# Patient Record
Sex: Male | Born: 1995 | Race: White | Hispanic: No | Marital: Single | Smoking: Never smoker
Health system: Southern US, Community
[De-identification: ages and names within clinical notes are randomized; demographics above are authoritative.]

## PROBLEM LIST (undated history)

## (undated) DIAGNOSIS — E063 Autoimmune thyroiditis: Secondary | ICD-10-CM

## (undated) DIAGNOSIS — E049 Nontoxic goiter, unspecified: Secondary | ICD-10-CM

## (undated) DIAGNOSIS — E230 Hypopituitarism: Secondary | ICD-10-CM

## (undated) DIAGNOSIS — I422 Other hypertrophic cardiomyopathy: Secondary | ICD-10-CM

## (undated) DIAGNOSIS — R6250 Unspecified lack of expected normal physiological development in childhood: Secondary | ICD-10-CM

## (undated) HISTORY — DX: Hypopituitarism: E23.0

## (undated) HISTORY — DX: Autoimmune thyroiditis: E06.3

## (undated) HISTORY — PX: CIRCUMCISION: SUR203

## (undated) HISTORY — DX: Nontoxic goiter, unspecified: E04.9

## (undated) HISTORY — DX: Unspecified lack of expected normal physiological development in childhood: R62.50

## (undated) HISTORY — DX: Other hypertrophic cardiomyopathy: I42.2

---
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
## 2017-05-04 ENCOUNTER — Encounter: Payer: Self-pay | Admitting: Physician Assistant

## 2017-05-04 ENCOUNTER — Ambulatory Visit: Payer: BC Managed Care – PPO | Admitting: Physician Assistant

## 2017-05-04 ENCOUNTER — Other Ambulatory Visit: Payer: Self-pay

## 2017-05-04 ENCOUNTER — Ambulatory Visit (INDEPENDENT_AMBULATORY_CARE_PROVIDER_SITE_OTHER): Payer: BC Managed Care – PPO

## 2017-05-04 VITALS — BP 102/66 | HR 77 | Temp 98.7°F | Resp 16 | Ht 68.0 in | Wt 126.8 lb

## 2017-05-04 DIAGNOSIS — Q799 Congenital malformation of musculoskeletal system, unspecified: Secondary | ICD-10-CM | POA: Diagnosis not present

## 2017-05-04 NOTE — Progress Notes (Signed)
    05/04/2017 4:08 PM   DOB: 1996-02-11 / MRN: [id number]  SUBJECTIVE:  Benjamin Martinez is a 22 y.o. male presenting for bony growth about the dorsum of the left foot.  Started about 3 months ago.  He appears to be medically complex per his history.  He is feeling well at this time.  He has No Known Allergies.   He  has a past medical history of Goiter, Growth hormone deficiency (human) (HCC), Hypertrophic cardiomyopathy (HCC), Physical growth delay, and Thyroiditis, autoimmune.    He  reports that  has never smoked. he has never used smokeless tobacco. He reports that he drinks alcohol. He reports that he does not use drugs. He  reports that he does not engage in sexual activity. The patient  has a past surgical history that includes Circumcision.  His family history is not on file. He was adopted.  Review of Systems  Constitutional: Negative for fever, malaise/fatigue and weight loss.    The problem list and medications were reviewed and updated by myself where necessary and exist elsewhere in the encounter.   OBJECTIVE:  BP 102/66 (BP Location: Left Arm, Patient Position: Sitting, Cuff Size: Normal)   Pulse 77   Temp 98.7 F (37.1 C) (Oral)   Resp 16   Ht 5\' 8"  (1.727 m)   Wt 126 lb 12.8 oz (57.5 kg)   SpO2 99%   BMI 19.28 kg/m   Physical Exam  Constitutional: He appears well-developed. He is active and cooperative.  Non-toxic appearance.  Cardiovascular: Normal rate.  Pulmonary/Chest: Effort normal. No tachypnea.  Musculoskeletal:       Feet:  Neurological: He is alert.  Skin: Skin is warm and dry. He is not diaphoretic. No pallor.  Vitals reviewed.   No results found for this or any previous visit (from the past 72 hour(s)).  No results found.  ASSESSMENT AND PLAN:  Lyn Hollingsheadlexander was seen today for bone spur top of left foot.  Diagnoses and all orders for this visit:  Bony abnormality: Patient is interesting.  Appears he has a history of hypertrophic  cardiomyopathy along with several endocrinology abnormalities.  [facility]  I am going to send him to podiatry.  He does not have a great deal of pain with rest but this bone callus does bother him with physical activity. -     DG Foot Complete Left; Future -     Calcium -     Phosphorus -     Alkaline phosphatase      The patient is advised to call or return to clinic if he does not see an improvement in symptoms, or to seek the care of the closest emergency department if he worsens with the above plan.   Deliah BostonMichael Clark, MHS, PA-C [facility] at [facility] 05/04/2017 4:08 PM

## 2017-05-04 NOTE — Patient Instructions (Signed)
     IF you received an x-ray today, you will receive an invoice from [facility]. Please contact [facility] at [phone] with questions or concerns regarding your invoice.   IF you received labwork today, you will receive an invoice from [facility]. Please contact [facility] at [phone] with questions or concerns regarding your invoice.   Our billing staff will not be able to assist you with questions regarding bills from these companies.  You will be contacted with the lab results as soon as they are available. The fastest way to get your results is to activate your My Chart account. Instructions are located on the last page of this paperwork. If you have not heard from us regarding the results in 2 weeks, please contact this office.     

## 2017-05-05 LAB — CALCIUM: CALCIUM: 9.5 mg/dL (ref 8.7–10.2)

## 2017-05-05 LAB — PHOSPHORUS: Phosphorus: 3.6 mg/dL (ref 2.5–4.5)

## 2017-05-05 LAB — ALKALINE PHOSPHATASE: Alkaline Phosphatase: 46 IU/L (ref 39–117)

## 2017-05-24 ENCOUNTER — Ambulatory Visit: Payer: BC Managed Care – PPO | Admitting: Podiatry

## 2017-06-01 ENCOUNTER — Ambulatory Visit (INDEPENDENT_AMBULATORY_CARE_PROVIDER_SITE_OTHER): Payer: BC Managed Care – PPO

## 2017-06-01 ENCOUNTER — Ambulatory Visit: Payer: BC Managed Care – PPO | Admitting: Podiatry

## 2017-06-01 ENCOUNTER — Encounter: Payer: Self-pay | Admitting: Podiatry

## 2017-06-01 VITALS — BP 127/77 | HR 68

## 2017-06-01 DIAGNOSIS — M659 Unspecified synovitis and tenosynovitis, unspecified site: Secondary | ICD-10-CM

## 2017-06-01 DIAGNOSIS — M79671 Pain in right foot: Secondary | ICD-10-CM

## 2017-06-01 DIAGNOSIS — M19079 Primary osteoarthritis, unspecified ankle and foot: Secondary | ICD-10-CM

## 2017-06-01 DIAGNOSIS — M79672 Pain in left foot: Principal | ICD-10-CM

## 2017-06-01 MED ORDER — MELOXICAM 15 MG PO TABS: 15.0000 mg | ORAL_TABLET | Freq: Every day | ORAL | 0 refills | Status: DC

## 2017-06-18 NOTE — Progress Notes (Signed)
  Subjective:  Patient ID: Benjamin Martinez, male    DOB: 10-06-1995,  MRN: [id number]  Chief Complaint  Patient presents with  . Foot Pain    boney growth top of left foot - came up a few months ago, becoming painful   22 y.o. male presents with the above complaint.  Reports burning redness of left foot.  Denies injury or trauma.  States that it appeared few months ago.  Started to also have the same issue on the right side.  Says he tries to be active in the gym and has pain while doing so.  Past Medical History:  Diagnosis Date  . Goiter   . Growth hormone deficiency (human) (HCC)   . Hypertrophic cardiomyopathy (HCC)   . Physical growth delay   . Thyroiditis, autoimmune    Past Surgical History:  Procedure Laterality Date  . CIRCUMCISION      Current Outpatient Medications:  .  meloxicam (MOBIC) 15 MG tablet, Take 1 tablet (15 mg total) by mouth daily., Disp: 30 tablet, Rfl: 0  No Known Allergies Review of Systems: Negative except as noted in the HPI. Denies N/V/F/Ch. Objective:   Vitals:   06/01/17 0856  BP: 127/77  Pulse: 68   General AA&O x3. Normal mood and affect.  Vascular Dorsalis pedis and posterior tibial pulses  present 2+ bilaterally  Capillary refill normal to all digits. Pedal hair growth normal.  Neurologic Epicritic sensation grossly present.  Dermatologic No open lesions. Interspaces clear of maceration. Nails well groomed and normal in appearance.  Orthopedic: MMT 5/5 in dorsiflexion, plantarflexion, inversion, and eversion. Normal joint ROM without pain or crepitus.  Pain palpation dorsal second TMT with prominent dorsal osteophyte   Assessment & Plan:  Patient was evaluated and treated and all questions answered.  Midfoot Arthritis -XR taken and reviewed.  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 -Rx Meloxicam -Discussed considering surgical therapy should injections not help with his  pain  Procedure: Joint Injection Location: Left dorsal TMT joint Skin Prep: Alcohol. Injectate: 0.5 cc 1% lidocaine plain, 0.5 cc dexamethasone phosphate. Disposition: Patient tolerated procedure well. Injection site dressed with a band-aid.    Return in about 3 weeks (around 06/22/2017) for Arthritis f/u.

## 2017-06-22 ENCOUNTER — Ambulatory Visit: Payer: BC Managed Care – PPO | Admitting: Podiatry

## 2017-06-29 ENCOUNTER — Ambulatory Visit: Payer: BC Managed Care – PPO | Admitting: Podiatry

## 2017-06-29 ENCOUNTER — Other Ambulatory Visit: Payer: Self-pay

## 2017-07-05 ENCOUNTER — Ambulatory Visit: Payer: BC Managed Care – PPO | Admitting: Podiatry

## 2017-07-05 DIAGNOSIS — M19079 Primary osteoarthritis, unspecified ankle and foot: Secondary | ICD-10-CM

## 2017-07-05 DIAGNOSIS — M659 Synovitis and tenosynovitis, unspecified: Secondary | ICD-10-CM | POA: Diagnosis not present

## 2017-07-05 NOTE — Patient Instructions (Signed)
Pre-Operative Instructions  Congratulations, you have decided to take an important step towards improving your quality of life.  You can be assured that the doctors and staff at [facility] will be with you every step of the way.  Here are some important things you should know:  1. Plan to be at the surgery center/hospital at least 1 (one) hour prior to your scheduled time, unless otherwise directed by the surgical center/hospital staff.  You must have a responsible adult accompany you, remain during the surgery and drive you home.  Make sure you have directions to the surgical center/hospital to ensure you arrive on time. 2. If you are having surgery at [facility] or [facility] hospitals, you will need a copy of your medical history and physical form from your family physician within one month prior to the date of surgery. We will give you a form for your primary physician to complete.  3. We make every effort to accommodate the date you request for surgery.  However, there are times where surgery dates or times have to be moved.  We will contact you as soon as possible if a change in schedule is required.   4. No aspirin/ibuprofen for one week before surgery.  If you are on aspirin, any non-steroidal anti-inflammatory medications (Mobic, Aleve, Ibuprofen) should not be taken seven (7) days prior to your surgery.  You make take Tylenol for pain prior to surgery.  5. Medications - If you are taking daily heart and blood pressure medications, seizure, reflux, allergy, asthma, anxiety, pain or diabetes medications, make sure you notify the surgery center/hospital before the day of surgery so they can tell you which medications you should take or avoid the day of surgery. 6. No food or drink after midnight the night before surgery unless directed otherwise by surgical center/hospital staff. 7. No alcoholic beverages 24-hours prior to surgery.  No smoking 24-hours prior or 24-hours after  surgery. 8. Wear loose pants or shorts. They should be loose enough to fit over bandages, boots, and casts. 9. Don't wear slip-on shoes. Sneakers are preferred. 10. Bring your boot with you to the surgery center/hospital.  Also bring crutches or a walker if your physician has prescribed it for you.  If you do not have this equipment, it will be provided for you after surgery. 11. If you have not been contacted by the surgery center/hospital by the day before your surgery, call to confirm the date and time of your surgery. 12. Leave-time from work may vary depending on the type of surgery you have.  Appropriate arrangements should be made prior to surgery with your employer. 13. Prescriptions will be provided immediately following surgery by your doctor.  Fill these as soon as possible after surgery and take the medication as directed. Pain medications will not be refilled on weekends and must be approved by the doctor. 14. Remove nail polish on the operative foot and avoid getting pedicures prior to surgery. 15. Wash the night before surgery.  The night before surgery wash the foot and leg well with water and the antibacterial soap provided. Be sure to pay special attention to beneath the toenails and in between the toes.  Wash for at least three (3) minutes. Rinse thoroughly with water and dry well with a towel.  Perform this wash unless told not to do so by your physician.  Enclosed: 1 Ice pack (please put in freezer the night before surgery)   1 Hibiclens skin cleaner     Pre-op instructions  If you have any questions regarding the instructions, please do not hesitate to call our office.  [place]: [address], [place], [place] [address] -- [phone]  [place]: [address] [place], [place] [address] -- [phone]  [place]: [address]  [place], [place] [address] -- [phone]  [place]: [address], [address], [place], [place] [address] -- [phone]  Website:  [email or web address] 

## 2017-07-27 ENCOUNTER — Telehealth: Payer: Self-pay | Admitting: *Deleted

## 2017-07-27 ENCOUNTER — Encounter: Payer: Self-pay | Admitting: *Deleted

## 2017-07-27 NOTE — Telephone Encounter (Signed)
I received an [facility] [place] [facility].  She forwarded an email from Kellie MoorKayte Burney, English as a second language teacherre-Anesthesia RN.  She was inquiring if we had received medical clearance from [facility].  I was not aware he needed clearance.  I will send a letter to Dr. Elizebeth Brookingotton.  I sent a letter requesting clearance to Dr. Elizebeth Brookingotton as well as his primary care physician, Dr. Velvet BathePamela Warner.

## 2017-07-31 NOTE — Telephone Encounter (Signed)
I called Dr. Kimberlee NearingPamela Warner's office to check on the status of the medical clearance.  Jamila informed me that she is not in the office this morning but she would give it to her first thing in the morning.    "This is Jamila calling from [facility], Dr. Kimberlee NearingPamela Warner's office.  I spoke to Dr. Sheliah HatchWarner and she said she responded to the medical clearance request.  She said she got the request from you late Friday afternoon.  We had already closed.  She said she responded directly to Dr. Samuella CotaPrice about the medical clearance."  Can you possibly fax that to me?  "We are not on Epic, only the doctors have access to it."  Okay, I will keep looking for it.  Thank you so much for your help.  If you can't find it let me know and I will see if I can figure something out."  (Dr. Samuella CotaPrice, I can't find it in Epic.  Can you send it to me or send it to the surgical center please?)

## 2017-07-31 NOTE — Telephone Encounter (Signed)
We did receive the letter for clearance. I read it.

## 2017-07-31 NOTE — Telephone Encounter (Signed)
It was with my paperwork that I gave back to [employer]ica [employer]. Can we see if she has it?

## 2017-08-01 ENCOUNTER — Encounter: Payer: Self-pay | Admitting: Podiatry

## 2017-08-01 ENCOUNTER — Other Ambulatory Visit: Payer: Self-pay | Admitting: Podiatry

## 2017-08-01 DIAGNOSIS — M898X7 Other specified disorders of bone, ankle and foot: Secondary | ICD-10-CM | POA: Diagnosis not present

## 2017-08-01 DIAGNOSIS — M19072 Primary osteoarthritis, left ankle and foot: Secondary | ICD-10-CM | POA: Diagnosis not present

## 2017-08-01 MED ORDER — OXYCODONE-ACETAMINOPHEN 10-325 MG PO TABS: 1.0000 | ORAL_TABLET | ORAL | 0 refills | Status: DC | PRN

## 2017-08-01 MED ORDER — CEPHALEXIN 500 MG PO CAPS: 500.0000 mg | ORAL_CAPSULE | Freq: Two times a day (BID) | ORAL | 0 refills | Status: DC

## 2017-08-01 MED ORDER — PROMETHAZINE HCL 25 MG PO TABS: 25.0000 mg | ORAL_TABLET | Freq: Three times a day (TID) | ORAL | 0 refills | Status: DC | PRN

## 2017-08-03 ENCOUNTER — Other Ambulatory Visit: Payer: BC Managed Care – PPO

## 2017-08-09 ENCOUNTER — Ambulatory Visit (INDEPENDENT_AMBULATORY_CARE_PROVIDER_SITE_OTHER): Payer: Self-pay | Admitting: Podiatry

## 2017-08-09 ENCOUNTER — Ambulatory Visit (INDEPENDENT_AMBULATORY_CARE_PROVIDER_SITE_OTHER): Payer: BC Managed Care – PPO

## 2017-08-09 ENCOUNTER — Encounter: Payer: Self-pay | Admitting: Podiatry

## 2017-08-09 VITALS — Temp 97.8°F

## 2017-08-09 DIAGNOSIS — M659 Synovitis and tenosynovitis, unspecified: Secondary | ICD-10-CM

## 2017-08-09 DIAGNOSIS — M19079 Primary osteoarthritis, unspecified ankle and foot: Secondary | ICD-10-CM

## 2017-08-09 DIAGNOSIS — M898X7 Other specified disorders of bone, ankle and foot: Secondary | ICD-10-CM

## 2017-08-10 NOTE — Progress Notes (Signed)
DOS 08/01/2017  Midfoot exostectomy left.

## 2017-08-12 NOTE — Progress Notes (Signed)
  Subjective:  Patient ID: Benjamin Martinez, male    DOB: [birth year],  MRN: [id number]  Chief Complaint  Patient presents with  . Routine Post [employer]     dos 06.12.2019 Midfoot Exostectomy Lt " my foot hurt day 2-3 but feels fine now"     DOS: 08/01/17 Procedure: Midfoot exostectomy left  22 y.o. male returns for post-op check. Denies N/V/F/Ch. Pain is controlled with current medications.  Only had pain the first 2 days and had a history of chills right after the procedure but today feels fine  Objective:   General AA&O x3. Normal mood and affect.  Vascular Foot warm and well perfused.  Neurologic Gross sensation intact.  Dermatologic Skin healing well without signs of infection. Skin edges well coapted without signs of infection.  Orthopedic: Tenderness to palpation noted about the surgical site.   Assessment & Plan:  Patient was evaluated and treated and all questions answered.  S/p midfoot exostectomy left  -X-rays taken reviewed consistent with postop state -Progressing as expected post-operatively. -Sutures: Intact none. -Medications refilled: none -Foot redressed.  Return in about 1 week (around 08/16/2017).

## 2017-08-12 NOTE — Progress Notes (Signed)
  Subjective:  Patient ID: Benjamin Martinez, male    DOB: 1995/08/25,  MRN: [id number]  Chief Complaint  Patient presents with  . Arthritis    midfoot left - felt better x 10days or so, but now pain is back   22 y.o. male presents with the above complaint.  States the injection felt better for about 10 days or so but now is back.  Here with father today to further discuss  Past Medical History:  Diagnosis Date  . Goiter   . Growth hormone deficiency (human) (HCC)   . Hypertrophic cardiomyopathy (HCC)   . Physical growth delay   . Thyroiditis, autoimmune    Past Surgical History:  Procedure Laterality Date  . CIRCUMCISION      Current Outpatient Medications:  .  cephALEXin (KEFLEX) 500 MG capsule, Take 1 capsule (500 mg total) by mouth 2 (two) times daily., Disp: 14 capsule, Rfl: 0 .  meloxicam (MOBIC) 15 MG tablet, Take 1 tablet (15 mg total) by mouth daily., Disp: 30 tablet, Rfl: 0 .  oxyCODONE-acetaminophen (PERCOCET) 10-325 MG tablet, Take 1 tablet by mouth every 4 (four) hours as needed for pain., Disp: 20 tablet, Rfl: 0 .  promethazine (PHENERGAN) 25 MG tablet, Take 1 tablet (25 mg total) by mouth every 8 (eight) hours as needed for nausea or vomiting., Disp: 20 tablet, Rfl: 0 .  Somatropin (NORDITROPIN FLEXPRO) 30 MG/3ML SOLN, , Disp: , Rfl:   No Known Allergies Review of Systems: Negative except as noted in the HPI. Denies N/V/F/Ch. Objective:   There were no vitals filed for this visit. General AA&O x3. Normal mood and affect.  Vascular Dorsalis pedis and posterior tibial pulses  present 2+ bilaterally  Capillary refill normal to all digits. Pedal hair growth normal.  Neurologic Epicritic sensation grossly present.  Dermatologic No open lesions. Interspaces clear of maceration. Nails well groomed and normal in appearance.  Orthopedic: MMT 5/5 in dorsiflexion, plantarflexion, inversion, and eversion. Normal joint ROM without pain or crepitus.  Pain palpation dorsal  second TMT with prominent dorsal osteophyte   Assessment & Plan:  Patient was evaluated and treated and all questions answered.  Midfoot Arthritis -Discussed benefits of removing excess bone to prevent further irritation.  Patient understands and wishes to proceed all risk benefits and alternatives discussed with patient no guarantees given.  We will proceed for surgical  procedure in the ensuing weeks.  Patient has a cardiac history though he is young and healthy would recommend cardiac clearance prior to surgery.   Return for post op care.

## 2017-08-14 ENCOUNTER — Other Ambulatory Visit (INDEPENDENT_AMBULATORY_CARE_PROVIDER_SITE_OTHER): Payer: Self-pay | Admitting: *Deleted

## 2017-08-14 DIAGNOSIS — E034 Atrophy of thyroid (acquired): Secondary | ICD-10-CM

## 2017-08-16 ENCOUNTER — Ambulatory Visit (INDEPENDENT_AMBULATORY_CARE_PROVIDER_SITE_OTHER): Payer: BC Managed Care – PPO | Admitting: Podiatry

## 2017-08-16 DIAGNOSIS — Z9889 Other specified postprocedural states: Secondary | ICD-10-CM

## 2017-08-16 DIAGNOSIS — M898X7 Other specified disorders of bone, ankle and foot: Secondary | ICD-10-CM

## 2017-08-16 DIAGNOSIS — M19079 Primary osteoarthritis, unspecified ankle and foot: Secondary | ICD-10-CM

## 2017-08-16 NOTE — Progress Notes (Signed)
  Subjective:  Patient ID: Benjamin Martinez, male    DOB: November 19, 1995,  MRN: [id number]  Chief Complaint  Patient presents with  . Routine Post Op    pov#2 dos 06.12.2019 Midfoot Exostectomy Lt     DOS: 08/01/17 Procedure: Midfoot exostectomy left  22 y.o. male returns for post-op check. Denies N/V/F/Ch. Pain controlled. Denies complaints.  Objective:   General AA&O x3. Normal mood and affect.  Vascular Foot warm and well perfused.  Neurologic Gross sensation intact.  Dermatologic Skin healing well without signs of infection. Skin edges well coapted without signs of infection.  Orthopedic: No tenderness to palpation noted about the surgical site.   Assessment & Plan:  Patient was evaluated and treated and all questions answered.  S/p midfoot exostectomy left  -X-rays taken reviewed consistent with postop state -Progressing as expected post-operatively. -Sutures: ends cut. -Medications refilled: none -Foot redressed. -Switch to WBAT in surgical shoe. Shoe dispensed.  Return in about 2 weeks (around 08/30/2017) for Post-op.

## 2017-08-17 LAB — TSH: TSH: 0.95 m[IU]/L (ref 0.40–4.50)

## 2017-08-17 LAB — T4, FREE: Free T4: 1.5 ng/dL — ABNORMAL HIGH (ref 0.8–1.4)

## 2017-08-17 LAB — TESTOSTERONE TOTAL,FREE,BIO, MALES
ALBUMIN MSPROF: 4.7 g/dL (ref 3.6–5.1)
SEX HORMONE BINDING: 46 nmol/L (ref 10–50)
TESTOSTERONE BIOAVAILABLE: 174.1 ng/dL (ref >=110.0)
Testosterone, Free: 81.2 pg/mL (ref 46.0–224.0)
Testosterone: 757 ng/dL (ref 250–827)

## 2017-08-17 LAB — T3, FREE: T3, Free: 3.4 pg/mL (ref 3.0–4.7)

## 2017-08-21 ENCOUNTER — Encounter (INDEPENDENT_AMBULATORY_CARE_PROVIDER_SITE_OTHER): Payer: Self-pay | Admitting: "Endocrinology

## 2017-08-21 ENCOUNTER — Ambulatory Visit (INDEPENDENT_AMBULATORY_CARE_PROVIDER_SITE_OTHER): Payer: BC Managed Care – PPO | Admitting: "Endocrinology

## 2017-08-21 VITALS — BP 108/62 | HR 76 | Ht 68.7 in | Wt 121.2 lb

## 2017-08-21 DIAGNOSIS — R634 Abnormal weight loss: Secondary | ICD-10-CM | POA: Diagnosis not present

## 2017-08-21 DIAGNOSIS — E063 Autoimmune thyroiditis: Secondary | ICD-10-CM | POA: Diagnosis not present

## 2017-08-21 DIAGNOSIS — E049 Nontoxic goiter, unspecified: Secondary | ICD-10-CM

## 2017-08-21 DIAGNOSIS — I422 Other hypertrophic cardiomyopathy: Secondary | ICD-10-CM

## 2017-08-21 NOTE — Progress Notes (Signed)
Subjective:  Patient Name: Benjamin Martinez Date of Birth: 1995/10/16  MRN: [id number]  Benjamin Martinez  presents to the office today for follow-up evaluation and management of his growth hormone deficiency, physical growth delay, goiter, Hashimoto's thyroiditis, poor appetite, hypertrophic cardiomyopathy, ADHD, and migraines.  HISTORY OF PRESENT ILLNESS:   Benjamin Martinez is a 22 y.o. Caucasian young man.    [name] was accompanied by his adoptive father.  1. 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 He also had a problem of poor appetite due in large part to stimulant medications that he was taking for his ADHD. Even after the stimulants were discontinued for a time, however, his appetite remained poor. He was started on cyproheptadine as an appetite stimulant. As he grew his appetite improved and he subsequently discontinued the cyproheptadine. He also became progressively non-compliant with the [facility] injections. He stopped the [facility] injections in the Spring of 2016.  2. Benjamin Martinez has also had a goiter that has waxed and waned in size over time, c/w evolving Hashimoto's thyroiditis, but he has remained euthyroid.   3. The patient's last [facility] visit was on 09/06/15. In the interim he has been healthy. He had left foot bone spur surgery about 3 weeks ago and is still recovering. He has not had any migraines for a long time.  He stopped his ADHD medications several years ago.    4. Pertinent Review of Systems:  Constitutional: Benjamin Martinez feels "pretty good".  He seems healthy and active. Eyes: Vision seems to be good. There are no recognized eye problems. He says his eye doctor told him that he does not need to wear his reading glasses. Dad concurs. Neck: The patient has no complaints of anterior neck swelling, soreness, tenderness, pressure, discomfort, or difficulty swallowing.   Heart: Heart rate increases with exercise  or other physical activity. The patient has no complaints of palpitations, irregular heart beats, chest pain, or chest pressure.  He is followed by Dr. Elizebeth Brooking for left ventricular hypertrophic cardiomyopathy. At his last follow up appointment with Dr. Elizebeth Brooking on 12/10/15, he says that Dr. Elizebeth Brooking told him that his cardiomyopathy seemed to be better. His follow up visits were decreased to every 18 or 24 months. Dr. Elizebeth Brooking cleared him to play school sports.   Gastrointestinal: Bowel movents seem normal. The patient has no complaints of excessive hunger, acid reflux, upset stomach, stomach aches or pains, diarrhea, or constipation.  Legs: Muscle mass and strength seem normal. There are no complaints of numbness, tingling, burning, or pain. No edema is noted.  Feet: As above. There are no other obvious foot problems. There are no complaints of numbness, tingling, burning, or pain. No edema is noted. Neurologic: There are no recognized problems with muscle movement and strength, sensation, or coordination. GU: He has more pubic hair and increased genital growth. He has more axillary hair. Voice is a bit deeper. He shaves about every 2-3 days.    PAST MEDICAL, FAMILY, AND SOCIAL HISTORY  Past Medical History:  Diagnosis Date  . Goiter   . Growth hormone deficiency (human) (HCC)   . Hypertrophic cardiomyopathy (HCC)   . Physical growth delay   . Thyroiditis, autoimmune     Family History  Adopted: Yes     Current Outpatient Medications:  .  cephALEXin (KEFLEX) 500 MG capsule, Take 1 capsule (500 mg total) by mouth 2 (two) times daily. (Patient not taking: Reported on 08/21/2017),  Disp: 14 capsule, Rfl: 0 .  meloxicam (MOBIC) 15 MG tablet, Take 1 tablet (15 mg total) by mouth daily. (Patient not taking: Reported on 08/21/2017), Disp: 30 tablet, Rfl: 0 .  oxyCODONE-acetaminophen (PERCOCET) 10-325 MG tablet, Take 1 tablet by mouth every 4 (four) hours as needed for pain. (Patient not taking: Reported on  08/21/2017), Disp: 20 tablet, Rfl: 0 .  promethazine (PHENERGAN) 25 MG tablet, Take 1 tablet (25 mg total) by mouth every 8 (eight) hours as needed for nausea or vomiting. (Patient not taking: Reported on 08/21/2017), Disp: 20 tablet, Rfl: 0 .  Somatropin (NORDITROPIN FLEXPRO) 30 MG/3ML SOLN, , Disp: , Rfl:   Allergies as of 08/21/2017  . (No Known Allergies)     reports that he has never smoked. He has never used smokeless tobacco. He reports that he drinks alcohol. He reports that he does not use drugs. Pediatric History  Patient Guardian Status  . Mother:  Samrat, Hayward  . Father:  Prynce, Jacober   Other Topics Concern  . Not on file  Social History Narrative   Lives with parents, grandparents and sister. Adopted at 9 months. 9th grade [employer]. Plays basketball. [employer]   School and family: He attended [employer] for two years and will attend [employer] in the Fall. He is majoring in pre-business.  Activities: He does not do much physical activity anymore.  Primary Care Provider: Velvet Bathe, MD  REVIEW OF SYSTEMS: There are no other significant problems involving Benjamin Martinez's other body systems.   Objective:  Vital Signs:  BP 108/62   Pulse 76   Ht 5' 8.7" (1.745 m)   Wt 121 lb 3.2 oz (55 kg)   BMI 18.05 kg/m    Ht Readings from Last 3 Encounters:  08/21/17 5' 8.7" (1.745 m)  05/04/17 5\' 8"  (1.727 m)  12/15/16 5\' 8"  (1.727 m) (28 %, Z= -0.57)*   * Growth percentiles are based on CDC (Boys, 2-20 Years) data.   Wt Readings from Last 3 Encounters:  08/21/17 121 lb 3.2 oz (55 kg)  05/04/17 126 lb 12.8 oz (57.5 kg)  12/15/16 135 lb (61.2 kg) (18 %, Z= -0.92)*   * Growth percentiles are based on CDC (Boys, 2-20 Years) data.   HC Readings from Last 3 Encounters:  No data found for [facility]   Body surface area is 1.63 meters squared. Facility age limit for growth percentiles is 20 years. Facility age limit for growth percentiles is 20 years.  PHYSICAL  EXAM:  Constitutional: The patient appears healthy and slender. His height has plateaued. His weight has decreased by 7 pounds since his last visit. He is alert and bright today. His affect and insight are normal.  Head: The head is normocephalic. Face: The face appears normal. There are no obvious dysmorphic features. His nose is relatively large, as before. Since he is adopted, we do not know what is normal for his ethnicity.  His lips and ears are normal.  Eyes: The eyes are normally formed and spaced. Gaze is conjugate. There is no obvious arcus or proptosis. Moisture appears normal. Ears: The ears are normally placed and appear externally normal. Mouth: The oropharynx and tongue appear normal. Dentition appears to be normal for age. Oral moisture is normal. Neck: The neck appears to be visibly normal. The strap muscles are not as enlarged today as they were at his last visit. The thyroid gland is smaller at approximately 21 grams in size. The right lobe has shrunk back  to normal size. The left lobe is still mildly enlarged. The consistency of the thyroid gland is normal. The thyroid gland is not tender to palpation. Lungs: The lungs are clear to auscultation. Air movement is good. Heart: Heart rate and rhythm are regular. Heart sounds S1 and S2 are normal. He has a grade II-III/VI decrescendo systolic murmur whose audibility changed with respirations.  Abdomen: The abdomen is normal in size for the patient's age. Bowel sounds are normal. There is no obvious hepatomegaly, splenomegaly, or other mass effect.  Arms: Muscle size and bulk are normal for age. Hands: There is no obvious tremor. Phalangeal and metacarpophalangeal joints are normal. Palmar muscles are normal for age. Palmar skin is normal. Palmar moisture is also normal. Legs: Muscles appear normal for age. No edema is present. Neurologic: Strength is normal for age in both the upper and lower extremities. Muscle tone is normal. Sensation  to touch is normal in both legs.   LAB DATA:   Labs 08/15/17; TSH 0.95, free T4 1.5, free T3 3.4; testosterone 757  Labs 08/26/15: TSH 1.47, free T4 1.1, free T3 3.3; testosterone 623 (ref 250-827), free testosterone 55 (ref 0.6-159), bioavailable testosterone 117.4 ((ref 44-417) [Note: The testosterone assay changed since Benjamin Postlex' last visit. As a result, I'm not sure if he has had any significant change in testosterone from 2016 to 2017.]  Labs 08/16/14: TSH 1.621, free T4 1.28, free T3 3.8; IGF-1 298; testosterone 673, free testosterone 94.9  Labs 01/20/14: TSH 0.866, free T4 1.34, free T3 4.0, TPO antibody 13 (normal < 9); IGF-1 335  Labs 05/14/13: TSH 1.1.2, free T4 1.29, free T3 3.6, TPO antibody < 10; IGF-1 332 (decreased from 367)  Labs 05/07/12: TSH 1.645, free T4 1.15, free T3 4.2; IGF-1 367, IGF BP-3 6385, CMP normal, celiac panel negative  Results for orders placed or performed in visit on 08/14/17 (from the past 504 hour(s))  Testosterone Total,Free,Bio, Males   Collection Time: 08/15/17 12:00 AM  Result Value Ref Range   Testosterone 757 250 - 827 ng/dL   Albumin 4.7 3.6 - 5.1 g/dL   Sex Hormone Binding 46 10 - 50 nmol/L   Testosterone, Free 81.2 46.0 - 224.0 pg/mL   Testosterone, Bioavailable 174.1 110.0 - 575 ng/dL  TSH   Collection Time: 08/15/17 12:00 AM  Result Value Ref Range   TSH 0.95 0.40 - 4.50 mIU/L  T4, free   Collection Time: 08/15/17 12:00 AM  Result Value Ref Range   Free T4 1.5 (H) 0.8 - 1.4 ng/dL  T3, free   Collection Time: 08/15/17 12:00 AM  Result Value Ref Range   T3, Free 3.4 3.0 - 4.7 pg/mL    IMAGING:  09/18/13 bone age: Bone age was read as 16-3 at a chronologic age of 20-6. I reviewed the image and concurred.  05/15/12: bone age: Bone age was 6014 at chronological age 20-1.   Assessment and Plan:   ASSESSMENT:  1. Growth hormone deficiency/growth delay: Benjamin Postlex grew well with GH replacement therapy, especially after gaining more weight. After  stopping GH treatment in 2015, he continued to grow for awhile, but his linear growth has since plateaued.   2. Weight loss, unintentional: His weight has decreased again. Despite taking in fast food and sodas, his weight has decreased slightly. Since his strap muscles have decreased in size as he's reduced his physical activity, I surmise that some of his weight loss is muscle weight. It appears that his metabolism is also probably high.  3. Migraines: The headaches have essentially resolved.   4. ADHD: His ADHD was still somewhat of a problem during his senior year. He stopped taking ADHD medications several years ago on the advice of Dr. Elizebeth Brooking.  5. Goiter/thyroiditis: His thyroid gland is smaller today. The pattern of waxing and waning of thyroid gland size is c/w evolving Hashimoto's thyroiditis.The pattern of all three TFTs decreasing in parallel together from 2016 to 2017 was pathognomonic for an interval flare up of thyroiditis. He was clinically and chemically euthyroid in 2017 and is again clinically and chemically euthyroid today.   6. Cardiomyopathy: He is followed by Dr. Elizebeth Brooking of [facility].  7. Poor appetite: His appetite has varied over time, but is not really a problem now.   PLAN:  1. Diagnostic: TFTs and testosterone in 12-24 months.  2. Therapeutic: None at this time.  3. Patient education: Discussed plateauing of his growth velocity for height and his bone age. Discussed goiter and thyroiditis, including the potential for eventually becoming hypothyroid. Discussed his GH stimulation test results from 2008. The fact that his GH stimulated to 7.06 during that testing indicates that he almost certainly will not need GH therapy as an adult. Dad and alex were very pleased with today's visit and with the fact that I will continue to see him until I retire, which is not in the near future.  4. Follow-up: 12-24 months   Level of Service: This visit lasted in excess of 50 minutes. More than  50% of the visit was devoted to counseling.   Molli Knock, MD, CDE Adult and Pediatric Endocrinology

## 2017-08-21 NOTE — Patient Instructions (Signed)
Follow up visit and lab tests in 1-2 years.

## 2017-08-31 ENCOUNTER — Ambulatory Visit (INDEPENDENT_AMBULATORY_CARE_PROVIDER_SITE_OTHER): Payer: BC Managed Care – PPO | Admitting: Podiatry

## 2017-08-31 DIAGNOSIS — Z9889 Other specified postprocedural states: Secondary | ICD-10-CM

## 2017-08-31 NOTE — Progress Notes (Signed)
  Subjective:  Patient ID: Benjamin Martinez, male    DOB: 1995-12-10,  MRN: [id number]  Chief Complaint  Patient presents with  . Routine Post [employer]    dos 06.12.2019 Midfoot Exostectomy Lt    DOS: 08/01/17 Procedure: Midfoot exostectomy left  22 y.o. male returns for post-op check. Denies N/V/F/Ch. Only has slight swelling at post-op site.  Objective:   General AA&O x3. Normal mood and affect.  Vascular Foot warm and well perfused.  Neurologic Gross sensation intact.  Dermatologic Skin well healed.  Orthopedic: No tenderness to palpation noted about the surgical site.   Assessment & Plan:  Patient was evaluated and treated and all questions answered.  S/p midfoot exostectomy left  -Progressing as expected post-operatively. -Medications refilled: none -Foot redressed. -Transition to normal shoegear.  Return in about 1 month (around 09/28/2017) for Post-op.

## 2017-09-21 ENCOUNTER — Ambulatory Visit: Payer: BC Managed Care – PPO | Admitting: Family Medicine

## 2017-09-22 ENCOUNTER — Ambulatory Visit: Payer: BC Managed Care – PPO | Admitting: Family Medicine

## 2017-09-22 ENCOUNTER — Encounter: Payer: Self-pay | Admitting: Family Medicine

## 2017-09-22 VITALS — BP 119/66 | HR 59 | Temp 98.1°F | Resp 16 | Ht 68.7 in | Wt 122.6 lb

## 2017-09-22 DIAGNOSIS — Z020 Encounter for examination for admission to educational institution: Secondary | ICD-10-CM | POA: Diagnosis not present

## 2017-09-22 DIAGNOSIS — Z299 Encounter for prophylactic measures, unspecified: Secondary | ICD-10-CM | POA: Diagnosis not present

## 2017-09-22 DIAGNOSIS — Z111 Encounter for screening for respiratory tuberculosis: Secondary | ICD-10-CM | POA: Diagnosis not present

## 2017-09-22 DIAGNOSIS — Z23 Encounter for immunization: Secondary | ICD-10-CM

## 2017-09-22 NOTE — Progress Notes (Signed)
  Tuberculosis Risk Questionnaire  1. Yes  Were you born outside the [place] in one of the following parts of the world: [place], [place], [place], [place] or [place]?    2. NO Have you traveled outside the [place] and lived for more than one month in one of the following parts of the world: [place], [place], [place], [place] or [place]?    3. No Do you have a compromised immune system such as from any of the following conditions:HIV/AIDS, organ or bone marrow transplantation, diabetes, immunosuppressive medicines (e.g. Prednisone, Remicaide), leukemia, lymphoma, cancer of the head or neck, gastrectomy or jejunal bypass, end-stage renal disease (on dialysis), or silicosis?     4. No Have you ever or do you plan on working in: a residential care center, a health care facility, a jail or prison or homeless shelter?    5. No Have you ever: injected illegal drugs, used crack cocaine, lived in a homeless shelter  or been in jail or prison?     6. No Have you ever been exposed to anyone with infectious tuberculosis?  7. No Have you ever had a BCG vaccine? (BCG is a vaccine for tuberculosis  (TB) used in OTHER countries, NOT in the [place]).  8. No Have you ever been advised by a health care provider NOT to have a TB skin test?  9. No Have you ever had a POSITIVE TB skin test?  IF SO, when? n/a  IF SO, were you treated with INH? n/a  IF SO, where? n/a  Tuberculosis Symptom Questionnaire  Do you currently have any of the following symptoms?  1. No Unexplained cough lasting more than 3 weeks?   2. No Unexplained fever lasting more than 3 weeks.   3. No Night Sweats (sweating that leaves the bedclothes and sheets wet)     4. No Shortness of Breath   5. No Chest Pain   6. No Unintentional weight loss    7. No Unexplained fatigue (very tired for no reason)

## 2017-09-22 NOTE — Progress Notes (Signed)
Patient ID: Benjamin Martinez, male    DOB: 1995/07/07  Age: 22 y.o. MRN: [id number]  Chief Complaint  Patient presents with  . PPD Placement    for college     Subjective:  22 year old male who will be going to [employer] to [employer].  He is required to have a TB skin test done.  In questioning him, he has had no exposure but he was born and lived the first 539 months of his life in [place].  I explained to him that some countries do BCG, and if he has a reaction he will need to have a gold test done.  School does not require a physical form.  He filled out a questionnaire for them.   Current allergies, medications, problem list, past/family and social histories reviewed.  Objective:  BP 119/66   Pulse (!) 59   Temp 98.1 F (36.7 C)   Resp 16   Ht 5' 8.7" (1.745 m)   Wt 122 lb 9.6 oz (55.6 kg)   SpO2 100%   BMI 18.26 kg/m  No exam was required today.  He is doing well.  Assessment & Plan:   Assessment: 1. Need for prophylactic measure   2. Visit for TB skin test       Plan: PPD skin test.  He also is due a Tdap and is being given that.  In the event that this turned positive, it mom would be possible that he could have had BCG when he was born in [place].  Orders Placed This Encounter  Procedures  . Tdap vaccine greater than or equal to 7yo IM  . TB Skin Test    Order Specific Question:   Has patient ever tested positive?    Answer:   No    No orders of the defined types were placed in this encounter.        Patient Instructions   Your TB skin test needs to be interpreted in 48 to 72 hours.  Should it be positive (with a mosquito bite-like area developing in 2 to 3 days.  You would need an additional blood test done.  Return if any problems.    IF you received an x-ray today, you will receive an invoice from [facility]. Please contact [facility] at [phone] with questions or concerns regarding your invoice.   IF you received  labwork today, you will receive an invoice from [facility]. Please contact [facility] at [phone] with questions or concerns regarding your invoice.   Our billing staff will not be able to assist you with questions regarding bills from these companies.  You will be contacted with the lab results as soon as they are available. The fastest way to get your results is to activate your My Chart account. Instructions are located on the last page of this paperwork. If you have not heard from us regarding the results in 2 weeks, please contact this office.         Return in about 2 days (around 09/24/2017).   Janace Hoardavid Amneet Cendejas, MD 09/22/2017

## 2017-09-22 NOTE — Patient Instructions (Addendum)
Your TB skin test needs to be interpreted in 48 to 72 hours.  Should it be positive (with a mosquito bite-like area developing in 2 to 3 days.  You would need an additional blood test done.  Return if any problems.    IF you received an x-ray today, you will receive an invoice from [facility]. Please contact [facility] at [phone] with questions or concerns regarding your invoice.   IF you received labwork today, you will receive an invoice from [facility]. Please contact [facility] at [phone] with questions or concerns regarding your invoice.   Our billing staff will not be able to assist you with questions regarding bills from these companies.  You will be contacted with the lab results as soon as they are available. The fastest way to get your results is to activate your My Chart account. Instructions are located on the last page of this paperwork. If you have not heard from us regarding the results in 2 weeks, please contact this office.

## 2017-09-24 ENCOUNTER — Ambulatory Visit (INDEPENDENT_AMBULATORY_CARE_PROVIDER_SITE_OTHER): Payer: BC Managed Care – PPO | Admitting: Family Medicine

## 2017-09-24 DIAGNOSIS — Z111 Encounter for screening for respiratory tuberculosis: Secondary | ICD-10-CM

## 2017-09-24 LAB — TB SKIN TEST
Induration: 0 mm
TB Skin Test: NEGATIVE

## 2017-09-24 NOTE — Progress Notes (Signed)
TB read only visit

## 2017-09-28 ENCOUNTER — Encounter: Payer: BC Managed Care – PPO | Admitting: Podiatry

## 2018-09-17 ENCOUNTER — Telehealth: Payer: Self-pay | Admitting: Family Medicine

## 2018-09-17 NOTE — Telephone Encounter (Signed)
I have called pt and his mom answered. I have informed her that [facility] is not doing any antibody testing for there are too many unanswered questions surrounding the test. They stated understanding.

## 2018-09-17 NOTE — Telephone Encounter (Signed)
Patient and mother inquiring where patient can get the anti body, please advise

## 2018-12-27 ENCOUNTER — Other Ambulatory Visit: Payer: Self-pay | Admitting: Podiatry

## 2018-12-27 DIAGNOSIS — M79672 Pain in left foot: Secondary | ICD-10-CM

## 2018-12-28 ENCOUNTER — Ambulatory Visit (INDEPENDENT_AMBULATORY_CARE_PROVIDER_SITE_OTHER): Payer: Managed Care, Other (non HMO) | Admitting: Podiatry

## 2018-12-28 ENCOUNTER — Other Ambulatory Visit: Payer: Self-pay

## 2018-12-28 ENCOUNTER — Ambulatory Visit (INDEPENDENT_AMBULATORY_CARE_PROVIDER_SITE_OTHER): Payer: Managed Care, Other (non HMO)

## 2018-12-28 DIAGNOSIS — I422 Other hypertrophic cardiomyopathy: Secondary | ICD-10-CM | POA: Diagnosis not present

## 2018-12-28 DIAGNOSIS — M19079 Primary osteoarthritis, unspecified ankle and foot: Secondary | ICD-10-CM

## 2018-12-28 DIAGNOSIS — M19071 Primary osteoarthritis, right ankle and foot: Secondary | ICD-10-CM

## 2018-12-28 DIAGNOSIS — M898X7 Other specified disorders of bone, ankle and foot: Secondary | ICD-10-CM | POA: Diagnosis not present

## 2018-12-28 DIAGNOSIS — E063 Autoimmune thyroiditis: Secondary | ICD-10-CM | POA: Diagnosis not present

## 2018-12-28 DIAGNOSIS — M79672 Pain in left foot: Secondary | ICD-10-CM

## 2018-12-28 NOTE — Progress Notes (Signed)
  Subjective:  Patient ID: Benjamin Martinez, male    DOB: [birth year],  MRN: [id number]  Chief Complaint  Patient presents with  . Foot Pain    Rt 1st met/dorsal foot pain x early this year; 5/10 sharp occasional pain -pt dneis swellign -w/ redness -pt states," it's a knot or a bone spur." Tx: none     23 y.o. male presents with the above complaint.  Doing very well with the left foot not having any pain or concerns.  Now having the same issue on the right foot.  Would like to discuss options for the right foot.  Review of Systems: Negative except as noted in the HPI. Denies N/V/F/Ch.  Past Medical History:  Diagnosis Date  . Goiter   . 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 Hypertrophic cardiomyopathy ([place])   . Physical growth delay   . Thyroiditis, autoimmune    No current outpatient medications on file.  Social History   Tobacco Use  Smoking Status Never Smoker  Smokeless Tobacco Never Used    No Known Allergies Objective:  There were no vitals filed for this visit. There is no height or weight on file to calculate BMI. Constitutional Well developed. Well nourished.  Vascular Dorsalis pedis pulses palpable bilaterally. Posterior tibial pulses palpable bilaterally. Capillary refill normal to all digits.  No cyanosis or clubbing noted. Pedal hair growth normal.  Neurologic Normal speech. Oriented to person, place, and time. Epicritic sensation to light touch grossly present bilaterally.  Dermatologic Nails well groomed and normal in appearance. No open wounds. No skin lesions. Well-healed incision left foot  Orthopedic:  Painful midfoot exostosis dorsal first second TMT right.  No pain palpation about the dorsal TMT's on the left foot   Radiographs: Taken and reviewed midfoot degenerative changes no acute fractures or dislocations Assessment:   1. Arthritis, midfoot   2. Exostosis of right foot   3. Hypertrophic cardiomyopathy ([place])   4. Thyroiditis,  autoimmune    Plan:  Patient was evaluated and treated and all questions answered.  Right midfoot exostosis -Discussed with patient conservative versus surgical care.  Offered midfoot injection.  Patient declined.  Discussed that we could benefit from midfoot exostectomy as performed on the left.  Patient amenable would like to proceed.  Risk-benefit of surgery were discussed with the patient no guarantees were given.  -Patient has failed all conservative therapy and wishes to proceed with surgical intervention. All risks, benefits, and alternatives discussed with patient. No guarantees given. Consent reviewed and signed by patient. -Planned procedures: Right midfoot exostectomy -Patient will need to get surgical clearance by his PCP possible cardiology

## 2018-12-30 ENCOUNTER — Other Ambulatory Visit: Payer: Self-pay | Admitting: Podiatry

## 2018-12-30 DIAGNOSIS — M19079 Primary osteoarthritis, unspecified ankle and foot: Secondary | ICD-10-CM

## 2018-12-31 ENCOUNTER — Telehealth: Payer: Self-pay | Admitting: *Deleted

## 2018-12-31 NOTE — Telephone Encounter (Signed)
"  I had an appointment at the [facility] this past Saturday.  They wanted me to call you and tell you that I would like to have the appointment set for December 2 to have surgery on my foot for a bone spur."  I am returning your call.  I have you scheduled for January 22, 2019.  "You do?  They told me to make sure I called to let you know that I wanted that date."  Yes, I have you scheduled.  You will get a call from someone from the surgical center a day or two prior to your surgery date and that person will give you your arrival time.

## 2019-01-07 ENCOUNTER — Encounter: Payer: Self-pay | Admitting: *Deleted

## 2019-01-07 NOTE — Progress Notes (Signed)
Per Dr. March Rummage, I sent a surgical medical clearance request letter to Dr. Suzan Slick.  Benjamin Martinez is scheduled to have surgery on 01/22/2019.

## 2019-01-15 ENCOUNTER — Telehealth: Payer: Self-pay | Admitting: *Deleted

## 2019-01-15 NOTE — Telephone Encounter (Signed)
DOS 01/22/2019 TARSAL EXOSTECTOMY RIGHT FOOT - 28104  [employer]: Eligibility Date - 11/14/2018 - 02/20/2019  In-Network:  Co-Insurance:  30% Individual  Benefit Begin Date: 11/14/2018  This benefit does apply to member's out-of-pocket maximum   Deductible:  $2,350 per Calendar Year  Individual  Benefit does apply to member's out-of-pocket maximum  Accumulators are shared between [place]  $2,082.50 Remaining  Individual  Benefit does apply to member's out-of-pocket maximum  Accumulators are shared between [place]   Out-Of-Pocket  $4,850 per Calendar Year  Individual  Accumulators are shared between [place]  [phone] Remaining  Individual  Accumulators are shared between   Surgical   In-Network: Out-of-Network:  Covered: Yes No  Deductible:  $0 per Calendar Year  Individual  Office  Authorization : Not Required  PCP

## 2019-01-22 ENCOUNTER — Encounter: Payer: Self-pay | Admitting: Podiatry

## 2019-01-22 ENCOUNTER — Other Ambulatory Visit: Payer: Self-pay | Admitting: Podiatry

## 2019-01-22 DIAGNOSIS — M25774 Osteophyte, right foot: Secondary | ICD-10-CM | POA: Diagnosis not present

## 2019-01-22 MED ORDER — CEPHALEXIN 500 MG PO CAPS: 500.0000 mg | ORAL_CAPSULE | Freq: Two times a day (BID) | ORAL | 0 refills | Status: AC

## 2019-01-22 MED ORDER — OXYCODONE-ACETAMINOPHEN 5-325 MG PO TABS: 1.0000 | ORAL_TABLET | ORAL | 0 refills | Status: AC | PRN

## 2019-01-22 NOTE — Progress Notes (Signed)
Rx sent to pharmacy for outpatient surgery. °

## 2019-01-23 ENCOUNTER — Telehealth: Payer: Self-pay

## 2019-01-23 NOTE — Telephone Encounter (Signed)
POST OP CALL-    1) General condition stated by the patient:   2) Is the pt having pain? Some, but not too bad.  3) Pain score:   4) Has the pt taken Rx'd pain medication, regularly or PRN?   5) Is the pain medication giving relief?  6) Any fever, chills, nausea, or vomiting, shortness of breath or tightness in calf? None  7) Is the bandage clean, dry and intact? Yes  8) Is there excessive tightness, bleeding or drainage coming through the bandage? None  9) Did you understand all of the post op instruction sheet given? Yes  10) Any questions or concerns regarding post op care/recovery? None    Confirmed POV appointment with patient

## 2019-01-31 ENCOUNTER — Other Ambulatory Visit: Payer: Self-pay

## 2019-01-31 ENCOUNTER — Ambulatory Visit (INDEPENDENT_AMBULATORY_CARE_PROVIDER_SITE_OTHER): Payer: Managed Care, Other (non HMO) | Admitting: Podiatry

## 2019-01-31 DIAGNOSIS — Z9889 Other specified postprocedural states: Secondary | ICD-10-CM

## 2019-01-31 DIAGNOSIS — M19079 Primary osteoarthritis, unspecified ankle and foot: Secondary | ICD-10-CM

## 2019-02-06 ENCOUNTER — Encounter: Payer: Managed Care, Other (non HMO) | Admitting: Podiatry

## 2019-02-20 ENCOUNTER — Ambulatory Visit (INDEPENDENT_AMBULATORY_CARE_PROVIDER_SITE_OTHER): Payer: Managed Care, Other (non HMO) | Admitting: Podiatry

## 2019-02-20 ENCOUNTER — Other Ambulatory Visit: Payer: Self-pay

## 2019-02-20 DIAGNOSIS — Z9889 Other specified postprocedural states: Secondary | ICD-10-CM

## 2019-02-20 NOTE — Progress Notes (Signed)
  Subjective:  Patient ID: Benjamin Martinez, male    DOB: 04-Sep-1995,  MRN: [id number]  Chief Complaint  Patient presents with  . Routine Post Op    POV#2 DOS 01/22/2019 TARSAL EXOSTECTOMY RT    DOS: 01/22/2019 Procedure: Tarsal exostectomy  23 y.o. male returns for post-op check. Doing very well having occasional pain at the toes.   Review of Systems: Negative except as noted in the HPI. Denies N/V/F/Ch.  Past Medical History:  Diagnosis Date  . Goiter   . 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 Hypertrophic cardiomyopathy ([place])   . Physical growth delay   . Thyroiditis, autoimmune     Current Outpatient Medications:  .  cephALEXin (KEFLEX) 500 MG capsule, Take 1 capsule (500 mg total) by mouth 2 (two) times daily., Disp: 14 capsule, Rfl: 0 .  oxyCODONE-acetaminophen (PERCOCET) 5-325 MG tablet, Take 1 tablet by mouth every 4 (four) hours as needed for severe pain., Disp: 20 tablet, Rfl: 0  Social History   Tobacco Use  Smoking Status Never Smoker  Smokeless Tobacco Never Used    No Known Allergies Objective:  There were no vitals filed for this visit. There is no height or weight on file to calculate BMI. Constitutional Well developed. Well nourished.  Vascular Foot warm and well perfused. Capillary refill normal to all digits.   Neurologic Normal speech. Oriented to person, place, and time. Epicritic sensation to light touch grossly present bilaterally.  Dermatologic Skin well healed sutures intact.  Orthopedic: No tenderness to palpation noted about the surgical site.   Radiographs: None Assessment:   1. Post-operative state    Plan:  Patient was evaluated and treated and all questions answered.  S/p foot surgery right -Progressing as expected post-operatively. -XR: None -WB Status: WBAT in surgical shoe -Sutures: absorbable. -Medications: None -Ok to shower but not soak.  Return in about 1 month (around 03/23/2019) for Post-Op (No XRs).

## 2019-02-23 NOTE — Progress Notes (Signed)
  Subjective:  Patient ID: Benjamin Martinez, male    DOB: [birth year],  MRN: [id number]  Chief Complaint  Patient presents with  . Routine Post Op    POV#1 DOS 01/22/2019 TARSAL EXOSTECTOMY RT. Pt states healing well with no concerns. Pt states he has some mild occasional pain but it is improving. Denies fever/nausea/vomiting/chills.    DOS: 01/22/2019 Procedure: Midfoot exostectomy right  24 y.o. male returns for post-op check.  States the area is healing well denies issues or concerns  Review of Systems: Negative except as noted in the HPI. Denies N/V/F/Ch.  Past Medical History:  Diagnosis Date  . Goiter   . Growth hormone deficiency (human) ([facility])   . Hypertrophic cardiomyopathy ([facility])   . Physical growth delay   . Thyroiditis, autoimmune     Current Outpatient Medications:  .  cephALEXin (KEFLEX) 500 MG capsule, Take 1 capsule (500 mg total) by mouth 2 (two) times daily., Disp: 14 capsule, Rfl: 0 .  oxyCODONE-acetaminophen (PERCOCET) 5-325 MG tablet, Take 1 tablet by mouth every 4 (four) hours as needed for severe pain., Disp: 20 tablet, Rfl: 0  Social History   Tobacco Use  Smoking Status Never Smoker  Smokeless Tobacco Never Used    No Known Allergies Objective:  There were no vitals filed for this visit. There is no height or weight on file to calculate BMI. Constitutional Well developed. Well nourished.  Vascular Foot warm and well perfused. Capillary refill normal to all digits.   Neurologic Normal speech. Oriented to person, place, and time. Epicritic sensation to light touch grossly present bilaterally.  Dermatologic Skin healing well without signs of infection. Skin edges well coapted without signs of infection.  Orthopedic: Tenderness to palpation noted about the surgical site.   Radiographs: none Assessment:  No diagnosis found. Plan:  Patient was evaluated and treated and all questions answered.  S/p foot surgery right -Progressing as expected  post-operatively. -XR: none -WB Status: WBAt in surgical shoe -Sutures: intact, absorbable. -Medications: none refilled. -Foot redressed.  No follow-ups on file.

## 2019-02-27 ENCOUNTER — Encounter: Payer: Managed Care, Other (non HMO) | Admitting: Podiatry

## 2019-03-20 ENCOUNTER — Other Ambulatory Visit: Payer: Self-pay

## 2019-03-20 ENCOUNTER — Ambulatory Visit (INDEPENDENT_AMBULATORY_CARE_PROVIDER_SITE_OTHER): Payer: Managed Care, Other (non HMO) | Admitting: Podiatry

## 2019-03-20 VITALS — Temp 97.1°F

## 2019-03-20 DIAGNOSIS — M19079 Primary osteoarthritis, unspecified ankle and foot: Secondary | ICD-10-CM

## 2019-03-20 DIAGNOSIS — Z9889 Other specified postprocedural states: Secondary | ICD-10-CM

## 2019-03-20 NOTE — Progress Notes (Signed)
  Subjective:  Patient ID: Benjamin Martinez, male    DOB: 1996-01-13,  MRN: [id number]  Chief Complaint  Patient presents with  . Post-op Follow-up    DOS 01/22/2019. Pt stated, "Feeling good. Occasional 3/10 pain - not as bad as it was".    DOS: 01/22/2019 Procedure: midfoot exostectomy right   24 y.o. male presents with the above complaint. History confirmed with patient. Wearing normal shoe without issue.  Objective:  Physical Exam: no tenderness at the surgical site, no edema noted and calf supple, nontender. Incision: well healed  Assessment:   1. Post-operative state   2. Arthritis, midfoot     Plan:  Patient was evaluated and treated and all questions answered.  Post-operative State -Well healed minimal pain tolerating normal shoegear this should resolve with time. -Discussed discharge with follow up as needed. Patient comfortable and agrees with plan.   Return if symptoms worsen or fail to improve.

## 2021-03-09 ENCOUNTER — Telehealth (INDEPENDENT_AMBULATORY_CARE_PROVIDER_SITE_OTHER): Payer: Self-pay | Admitting: "Endocrinology

## 2021-03-09 DIAGNOSIS — R6250 Unspecified lack of expected normal physiological development in childhood: Secondary | ICD-10-CM

## 2021-03-09 DIAGNOSIS — E049 Nontoxic goiter, unspecified: Secondary | ICD-10-CM

## 2021-03-09 DIAGNOSIS — E063 Autoimmune thyroiditis: Secondary | ICD-10-CM

## 2021-03-09 DIAGNOSIS — E230 Hypopituitarism: Secondary | ICD-10-CM

## 2021-03-09 NOTE — Telephone Encounter (Addendum)
°  Who's calling (name and relationship to patient) : Benjamin Martinez; dad  Best contact number: [phone]  Provider they see: Dr. Fransico Michael   Reason for call: Dad is requesting for a referral to an adult Endo. He stated that mom recommends Dr. Park Breed, but if Fransico Michael prefers someone else then that will be fine as well.  Dad has requested a call back  The number for Dr. Park Breed: [phone]   PRESCRIPTION REFILL ONLY  Name of prescription:  Pharmacy:

## 2021-06-16 ENCOUNTER — Telehealth (INDEPENDENT_AMBULATORY_CARE_PROVIDER_SITE_OTHER): Payer: Self-pay

## 2021-06-16 NOTE — Telephone Encounter (Signed)
Late documentation: at 4:50pm 06/15/21 this pts dad came into our office seeking advise on a referral to [facility] that Dr Fransico Michael had placed. The referral I saw, had been denied as the specific provider they had requested is no longer accepting new pts. From previous interaction with [facility] a representative had told me that they have no new patient openings until October 2023. I told pts dad that may have been the issue, but since the pt hadnt been seen since 2017, that the pt would likely need to get a PCP and a new referral from there, but I would speak with Dr Fransico Michael so he can determine the next best options for Benjamin Martinez. ? ?06/16/2021: conversation with Dr Fransico Michael: ? ? ?Had to leave HIP[employer] voicemail on dads phone number to have him call me back to relay this information. ?

## 2021-06-17 NOTE — Telephone Encounter (Signed)
Tried calling pts dad again, "call could not be competed". So I called the pt, Benjamin Martinez. Told him Dr Juluis Mire preferene for him to find a PCP and have him check his Thyroid levels and if they were good the PCP could follow him instead of Endocrinology. Pt stated understanding and had no further questions ?

## 2021-07-15 NOTE — Telephone Encounter (Signed)
Patient's father, Milton Ferguson, called again and would like to know which primary care provider or office Dr. Fransico Michael would recommend. Dad can be reached at  [phone] (DPR on file giving permission to speak with dad). Barrington Ellison

## 2021-09-22 ENCOUNTER — Encounter (INDEPENDENT_AMBULATORY_CARE_PROVIDER_SITE_OTHER): Payer: Self-pay
